# Patient Record
Sex: Female | Born: 1950 | Race: White | Hispanic: No | Marital: Married | Smoking: Never smoker
Health system: Southern US, Community
[De-identification: ages and names within clinical notes are randomized; demographics above are authoritative.]

## PROBLEM LIST (undated history)

## (undated) DIAGNOSIS — N3289 Other specified disorders of bladder: Secondary | ICD-10-CM

## (undated) DIAGNOSIS — M549 Dorsalgia, unspecified: Secondary | ICD-10-CM

## (undated) DIAGNOSIS — G629 Polyneuropathy, unspecified: Secondary | ICD-10-CM

## (undated) HISTORY — DX: Dorsalgia, unspecified: M54.9

## (undated) HISTORY — PX: AUGMENTATION MAMMAPLASTY: SUR837

## (undated) HISTORY — DX: Other specified disorders of bladder: N32.89

## (undated) HISTORY — DX: Polyneuropathy, unspecified: G62.9

---

## 2005-07-04 ENCOUNTER — Ambulatory Visit: Payer: Self-pay | Admitting: Gastroenterology

## 2005-08-10 ENCOUNTER — Ambulatory Visit: Payer: Self-pay | Admitting: Gastroenterology

## 2007-01-15 ENCOUNTER — Ambulatory Visit: Payer: Self-pay | Admitting: Orthopedic Surgery

## 2009-07-27 ENCOUNTER — Emergency Department: Payer: Self-pay | Admitting: Emergency Medicine

## 2013-04-08 ENCOUNTER — Encounter: Payer: Self-pay | Admitting: Diagnostic Neuroimaging

## 2013-04-08 ENCOUNTER — Ambulatory Visit (INDEPENDENT_AMBULATORY_CARE_PROVIDER_SITE_OTHER): Payer: BC Managed Care – PPO | Admitting: Diagnostic Neuroimaging

## 2013-04-08 ENCOUNTER — Other Ambulatory Visit: Payer: Self-pay | Admitting: *Deleted

## 2013-04-08 VITALS — BP 117/70 | HR 70 | Temp 98.7°F | Ht 67.0 in | Wt 126.0 lb

## 2013-04-08 DIAGNOSIS — R209 Unspecified disturbances of skin sensation: Secondary | ICD-10-CM

## 2013-04-08 DIAGNOSIS — R200 Anesthesia of skin: Secondary | ICD-10-CM

## 2013-04-08 DIAGNOSIS — R292 Abnormal reflex: Secondary | ICD-10-CM

## 2013-04-08 NOTE — Progress Notes (Signed)
[facility]  PATIENT: Megan Hays DOB: 1951-07-07  REFERRING CLINICIAN: Ramos HISTORY FROM: patient  REASON FOR VISIT: new consult   HISTORICAL  CHIEF COMPLAINT:  Chief Complaint  Patient presents with  . Neurologic Problem     Burning tingling in feet,  finger numbness    HISTORY OF PRESENT ILLNESS:   62 year old right-handed female here for evaluation of numbness, burning, tingling in hands and feet.  Patient has a long history of numbness and tingling in her feet, ankles, for many years, even as long as past 10 years. This was intermittent, and didn't bother her initially. More recently she has been having burning sensation in her feet, sensitivity to light touch such as the bed sheets on her toes, with nightly burning sensation. Symptoms also have progressed her hands. Sometimes the symptoms are independent, but oftentimes they're correlated in hands and feet together. Patient also notes intermittent color change in her fingertips, when he turned "white colored". She denies any blue, red or purple color changes in her fingers. She denies any cold intolerance in her fingers.  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 Patient has been on gabapentin 60 mg at night recently without significant relief.  REVIEW OF SYSTEMS: Full 14 system review of systems performed and notable only for fatigue joint pain and aching muscles not asleep numbness restless legs.  ALLERGIES: Not on File  HOME MEDICATIONS: No outpatient prescriptions prior to visit.   No facility-administered medications prior to visit.    PAST MEDICAL HISTORY: Past Medical History  Diagnosis Date  . Bladder spasms   . Back pain     PAST SURGICAL HISTORY: No past surgical history on file.  FAMILY HISTORY: Family History  Problem Relation Age of Onset  . Breast cancer Mother   . Lung cancer Father    . Diabetes Sister   . Heart disease Brother     SOCIAL HISTORY:  History   Social History  . Marital Status: Married    Spouse Name: N/A    Number of Children: 2  . Years of Education: 14   Occupational History  . national  [employer]    Social History Main Topics  . Smoking status: Never Smoker   . Smokeless tobacco: Not on file  . Alcohol Use: 0.0 oz/week     Comment: Patient drinks cafinated drinks 1 cup of coffee a day.Marland KitchenMarland KitchenDrinks wine occasionally  . Drug Use: Not on file  . Sexually Active: Not on file   Other Topics Concern  . Not on file   Social History Narrative  . No narrative on file     PHYSICAL EXAM  Filed Vitals:   04/08/13 0907  BP: 117/70  Pulse: 70  Temp: 98.7 F (37.1 C)  TempSrc: Oral  Height: 5\' 7"  (1.702 m)  Weight: 126 lb (57.153 kg)   Body mass index is 19.73 kg/(m^2).  GENERAL EXAM: Patient is in no distress  CARDIOVASCULAR: Regular rate and rhythm, no murmurs, no carotid bruits  NEUROLOGIC: MENTAL STATUS: awake, alert, language fluent, comprehension intact, naming intact CRANIAL NERVE: no papilledema on fundoscopic exam, pupils equal and reactive to light, visual fields full to confrontation, extraocular muscles intact, no nystagmus, facial sensation and strength symmetric, uvula midline, shoulder shrug symmetric, tongue midline. MOTOR: normal bulk and tone, full strength in the BUE, BLE SENSORY: normal and symmetric to light touch, pinprick, proprioception; RIGHT TOE VIB 10 SEC, LEFT TOE  VIB 5 SEC, DECR TEMP IN LEFT FOOT. COORDINATION: finger-nose-finger, fine finger movements normal REFLEXES: TRICEPS 3, BICEPS 2, BRACHIORAD 2, POSITIVE HOFFMANS ON RIGHT, KNEES 3 WITH SUPRAPATELLAR AND CROSSED ADDUCTORS, RIGHT ANKLE 2, LEFT ANKLE 1. MUTE TOES. GAIT/STATION: narrow based gait; able to walk on toes, heels and tandem; romberg is negative   DIAGNOSTIC DATA (LABS, IMAGING, TESTING) - I reviewed patient records, labs, notes, testing  and imaging myself where available.  No results found for this basename: WBC, HGB, HCT, MCV, PLT   No results found for this basename: na, k, cl, co2, glucose, bun, creatinine, calcium, prot, albumin, ast, alt, alkphos, bilitot, gfrnonaa, gfraa   No results found for this basename: CHOL, HDL, LDLCALC, LDLDIRECT, TRIG, CHOLHDL   No results found for this basename: HGBA1C   No results found for this basename: VITAMINB12   No results found for this basename: TSH     ASSESSMENT AND PLAN  62 y.o. year old female  has a past medical history of Bladder spasms and Back pain. here with numbness and tingling, burning sensation in the fingers, hands, toes and feet. Neurologic examination is notable for decreased sensation in the feet to vibration and temperature, and hyperreflexia in the upper and lower extremities. There is slightly increased reflexes on the right compared to left side.  Localization: cervical or thoracic spine, spinal root, nerve DDx: CNS inflammatory, compressive, degenerative, metabolic, autoimmune   Orders Placed This Encounter  Procedures  . MR Cervical Spine Wo Contrast  . Vitamin B12  . Hemoglobin A1c  . TSH  . NCV with EMG(electromyography)    PLAN: - continue gabapentin - EMG, labs, MRI c-spine  Suanne Marker, MD 04/08/2013, 10:07 AM Certified in Neurology, Neurophysiology and Neuroimaging  [facility] [address] [place], [place] [address] [phone]

## 2013-04-08 NOTE — Patient Instructions (Signed)
I will check MRI, EMG and labs.   

## 2013-04-09 LAB — HEMOGLOBIN A1C: Est. average glucose Bld gHb Est-mCnc: 114 mg/dL

## 2013-04-15 ENCOUNTER — Ambulatory Visit (INDEPENDENT_AMBULATORY_CARE_PROVIDER_SITE_OTHER): Payer: BC Managed Care – PPO | Admitting: Diagnostic Neuroimaging

## 2013-04-15 ENCOUNTER — Encounter (INDEPENDENT_AMBULATORY_CARE_PROVIDER_SITE_OTHER): Payer: BC Managed Care – PPO | Admitting: Radiology

## 2013-04-15 DIAGNOSIS — R292 Abnormal reflex: Secondary | ICD-10-CM

## 2013-04-15 DIAGNOSIS — R200 Anesthesia of skin: Secondary | ICD-10-CM

## 2013-04-15 DIAGNOSIS — Z0289 Encounter for other administrative examinations: Secondary | ICD-10-CM

## 2013-04-15 DIAGNOSIS — R209 Unspecified disturbances of skin sensation: Secondary | ICD-10-CM

## 2013-04-15 NOTE — Procedures (Signed)
   [facility]  NCS (NERVE CONDUCTION STUDY) WITH EMG (ELECTROMYOGRAPHY) REPORT   STUDY DATE: 04/15/13 PATIENT NAME: Megan Hays DOB: [birth year] MRN: [id number]  ORDERING CLINICIAN: Joycelyn Schmid, MD   TECHNOLOGIST: Kaylyn Lim ELECTROMYOGRAPHER: Glenford Bayley. Caitlain Tweed, MD  CLINICAL INFORMATION: 62 year old female with upper and lower extremity numbness and burning.  FINDINGS: NERVE CONDUCTION STUDY: Bilateral median, ulnar, peroneal and tibial motor responses have normal distal latencies, amplitudes, conduction velocities and F-wave latencies. Bilateral median, ulnar, sural sensory responses are normal. Bilateral ulnar transcarpal mixed nerve responses are normal. Bilateral median transcarpal mixed nerve responses have normal amplitudes and slow conduction velocities (right 43 m/s, left 39 m/s, normal greater than or equal to 48 m/s).  NEEDLE ELECTROMYOGRAPHY: Needle examination of selected muscles of the right upper and lower extremities (deltoid, biceps, triceps, flexor carpi radialis, first dorsal interosseous, vastus medialis, tibias interior, gastrocnemius) and right C5-6 and C6 and paraspinal muscles is normal. No abnormal spontaneous activity at rest and normal motor unit recruitment on exertion.  IMPRESSION:  Mildly abnormal study demonstrating: 1. Bilateral median neuropathies at the wrists consistent with bilateral carpal tunnel syndrome. 2. No evidence of underlying large fiber neuropathy. Based on clinical context, small fiber neuropathy cannot be excluded.   INTERPRETING PHYSICIAN:  Suanne Marker, MD Certified in Neurology, Neurophysiology and Neuroimaging  [facility] [address] [place], [place] [address] [phone]

## 2013-04-18 ENCOUNTER — Other Ambulatory Visit: Payer: BC Managed Care – PPO

## 2013-05-13 ENCOUNTER — Emergency Department: Payer: Self-pay | Admitting: Emergency Medicine

## 2014-04-07 ENCOUNTER — Encounter: Payer: Self-pay | Admitting: Family Medicine

## 2014-04-07 ENCOUNTER — Ambulatory Visit (INDEPENDENT_AMBULATORY_CARE_PROVIDER_SITE_OTHER): Payer: BC Managed Care – PPO | Admitting: Family Medicine

## 2014-04-07 VITALS — BP 114/70 | HR 79 | Temp 97.5°F | Ht 65.0 in | Wt 119.8 lb

## 2014-04-07 DIAGNOSIS — Z0000 Encounter for general adult medical examination without abnormal findings: Secondary | ICD-10-CM

## 2014-04-07 DIAGNOSIS — Z833 Family history of diabetes mellitus: Secondary | ICD-10-CM

## 2014-04-07 DIAGNOSIS — N3289 Other specified disorders of bladder: Secondary | ICD-10-CM

## 2014-04-07 DIAGNOSIS — Z79890 Hormone replacement therapy (postmenopausal): Secondary | ICD-10-CM

## 2014-04-07 DIAGNOSIS — Z136 Encounter for screening for cardiovascular disorders: Secondary | ICD-10-CM

## 2014-04-07 DIAGNOSIS — Z780 Asymptomatic menopausal state: Secondary | ICD-10-CM

## 2014-04-07 DIAGNOSIS — G609 Hereditary and idiopathic neuropathy, unspecified: Secondary | ICD-10-CM

## 2014-04-07 LAB — TSH: TSH: 0.83 u[IU]/mL (ref 0.35–5.50)

## 2014-04-07 LAB — COMPREHENSIVE METABOLIC PANEL
ALT: 15 U/L (ref 0–35)
AST: 22 U/L (ref 0–37)
Albumin: 4.2 g/dL (ref 3.5–5.2)
Alkaline Phosphatase: 41 U/L (ref 39–117)
BILIRUBIN TOTAL: 0.3 mg/dL (ref 0.3–1.2)
BUN: 11 mg/dL (ref 6–23)
CALCIUM: 9.1 mg/dL (ref 8.4–10.5)
CHLORIDE: 102 meq/L (ref 96–112)
CO2: 27 mEq/L (ref 19–32)
CREATININE: 0.7 mg/dL (ref 0.4–1.2)
GFR: 85.28 mL/min (ref >60.00)
Glucose, Bld: 88 mg/dL (ref 70–99)
Potassium: 4 mEq/L (ref 3.5–5.1)
Sodium: 136 mEq/L (ref 135–145)
Total Protein: 7 g/dL (ref 6.0–8.3)

## 2014-04-07 LAB — VITAMIN B12: Vitamin B-12: 819 pg/mL (ref 211–911)

## 2014-04-07 LAB — CBC WITH DIFFERENTIAL/PLATELET
BASOS PCT: 0.8 % (ref 0.0–3.0)
Basophils Absolute: 0.1 10*3/uL (ref 0.0–0.1)
EOS ABS: 0.3 10*3/uL (ref 0.0–0.7)
Eosinophils Relative: 3.4 % (ref 0.0–5.0)
HEMATOCRIT: 38.3 % (ref 36.0–46.0)
HEMOGLOBIN: 12.7 g/dL (ref 12.0–15.0)
LYMPHS ABS: 0.8 10*3/uL (ref 0.7–4.0)
LYMPHS PCT: 11.1 % — AB (ref 12.0–46.0)
MCHC: 33.1 g/dL (ref 30.0–36.0)
MCV: 95.9 fl (ref 78.0–100.0)
MONO ABS: 0.6 10*3/uL (ref 0.1–1.0)
Monocytes Relative: 7.7 % (ref 3.0–12.0)
NEUTROS ABS: 5.8 10*3/uL (ref 1.4–7.7)
Neutrophils Relative %: 77 % (ref 43.0–77.0)
Platelets: 274 10*3/uL (ref 150.0–400.0)
RBC: 4 Mil/uL (ref 3.87–5.11)
RDW: 13 % (ref 11.5–14.6)
WBC: 7.5 10*3/uL (ref 4.5–10.5)

## 2014-04-07 LAB — LIPID PANEL
CHOL/HDL RATIO: 2
Cholesterol: 178 mg/dL (ref 0–200)
HDL: 94.3 mg/dL (ref >39.00)
LDL Cholesterol: 55 mg/dL (ref 0–99)
TRIGLYCERIDES: 142 mg/dL (ref 0.0–149.0)
VLDL: 28.4 mg/dL (ref 0.0–40.0)

## 2014-04-07 LAB — HEMOGLOBIN A1C: Hgb A1c MFr Bld: 5 % (ref 4.6–6.5)

## 2014-04-07 NOTE — Assessment & Plan Note (Signed)
Reviewed preventive care protocols, scheduled due services, and updated immunizations Discussed nutrition, exercise, diet, and healthy lifestyle.  Orders Placed This Encounter  Procedures  . DG Bone Density  . CBC with Differential  . Comprehensive metabolic panel  . Lipid panel  . TSH  . Vitamin B12  . Hemoglobin A1c   Zostavax given today. Will request copy of colonoscopy [facility]). Bone density ordered.

## 2014-04-07 NOTE — Patient Instructions (Addendum)
Great to meet you. We will you with your lab results (and you can view them online) and your bone density appointment.

## 2014-04-07 NOTE — Assessment & Plan Note (Signed)
On Gabapentin 600 mg qhs. Check B12, TSH and a1c today.

## 2014-04-07 NOTE — Progress Notes (Signed)
Subjective:   Patient ID: Megan Hays Noorani, female    DOB: [birth year], 63 y.o.   MRN: [id number]  Megan Hays Manring is a pleasant 63 y.o. year old female who presents to clinic today with Establish Care and Annual Exam  on 04/07/2014  HPI: [place] in [place]. She is scheduling her mammogram for next week or two. Had a colonoscopy 5 or 7 years ago- per pt, 10 year recall. Has never had a bone density test. Never had a zostavax but did have chicken pox when she was younger.  Neuropathy- has seen neuro, cause is idiopathic.  Mainly at night- neurontin has been helping.  Recently retired- feels she is adjusting ok now.  It was hard at first. Husband goes to this practice as well, sees another MD.  Patient Active Problem List   Diagnosis Date Noted  . Unspecified hereditary and idiopathic peripheral neuropathy 04/07/2014  . Family history of diabetes mellitus 04/07/2014  . Routine general medical examination at a health care facility 04/07/2014  . Bladder spasms 04/07/2014   Past Medical History  Diagnosis Date  . Bladder spasms   . Back pain   . Neuropathy    History reviewed. No pertinent past surgical history. History  Substance Use Topics  . Smoking status: Never Smoker   . Smokeless tobacco: Never Used  . Alcohol Use: 0.0 oz/week     Comment: Patient drinks cafinated drinks 1 cup of coffee a day.Marland Kitchen.Marland Kitchen.Drinks wine occasionally   Family History  Problem Relation Age of Onset  . Breast cancer Mother   . Lung cancer Father   . Diabetes Sister   . Heart disease Brother    No Known Allergies Current Outpatient Prescriptions on File Prior to Visit  Medication Sig Dispense Refill  . gabapentin (NEURONTIN) 300 MG capsule Take 600 mg by mouth at bedtime.       Marland Kitchen. HYOPHEN 81.6 MG TABS       . ibuprofen (ADVIL,MOTRIN) 200 MG tablet Take 200 mg by mouth 2 (two) times daily.       No current facility-administered medications on file prior to visit.   The PMH, PSH, Social History,  Family History, Medications, and allergies [facility], and have been updated if relevant.   Review of Systems    See HPI No changes in her bowel habits No blood in her stool + urinary symptoms- has IC Denies any dysuria No CP or SOB  Objective:    BP 114/70  Pulse 79  Temp(Src) 97.5 F (36.4 C) (Oral)  Ht 5\' 5"  (1.651 m)  Wt 119 lb 12 oz (54.318 kg)  BMI 19.93 kg/m2  SpO2 98%   Physical Exam  General:  Well-developed,well-nourished,in no acute distress; alert,appropriate and cooperative throughout examination Head:  normocephalic and atraumatic.   Eyes:  vision grossly intact, pupils equal, pupils round, and pupils reactive to light.   Ears:  R ear normal and L ear normal.   Nose:  no external deformity.   Mouth:  good dentition.   Lungs:  Normal respiratory effort, chest expands symmetrically. Lungs are clear to auscultation, no crackles or wheezes. Heart:  Normal rate and regular rhythm. S1 and S2 normal without gallop, murmur, click, rub or other extra sounds. Abdomen:  Bowel sounds positive,abdomen soft and non-tender without masses, organomegaly or hernias noted. Msk:  No deformity or scoliosis noted of thoracic or lumbar spine.   Extremities:  No clubbing, cyanosis, edema, or deformity noted with normal full range  of motion of all joints.   Neurologic:  alert & oriented X3 and gait normal.   Skin:  Intact without suspicious lesions or rashes Cervical Nodes:  No lymphadenopathy noted Axillary Nodes:  No palpable lymphadenopathy Psych:  Cognition and judgment appear intact. Alert and cooperative with normal attention span and concentration. No apparent delusions, illusions, hallucinations        Assessment & Plan:   Unspecified hereditary and idiopathic peripheral neuropathy - Plan: TSH, Vitamin B12  Family history of diabetes mellitus - Plan: Hemoglobin A1c  Routine general medical examination at a health care facility - Plan: CBC with  Differential, Comprehensive metabolic panel  Screening for ischemic heart disease - Plan: Lipid panel  Post-menopausal - Plan: DG Bone Density  Bladder spasms No Follow-up on file.

## 2014-04-07 NOTE — Progress Notes (Signed)
Pre visit review using our clinic review tool, if applicable. No additional management support is needed unless otherwise documented below in the visit note. 

## 2014-04-08 ENCOUNTER — Encounter: Payer: Self-pay | Admitting: *Deleted

## 2014-06-30 ENCOUNTER — Encounter: Payer: Self-pay | Admitting: Internal Medicine

## 2014-06-30 ENCOUNTER — Ambulatory Visit (INDEPENDENT_AMBULATORY_CARE_PROVIDER_SITE_OTHER): Payer: BC Managed Care – PPO | Admitting: Internal Medicine

## 2014-06-30 VITALS — BP 120/70 | HR 70 | Temp 98.4°F | Wt 120.0 lb

## 2014-06-30 DIAGNOSIS — H1030 Unspecified acute conjunctivitis, unspecified eye: Secondary | ICD-10-CM

## 2014-06-30 DIAGNOSIS — H1033 Unspecified acute conjunctivitis, bilateral: Secondary | ICD-10-CM | POA: Insufficient documentation

## 2014-06-30 MED ORDER — SULFACETAMIDE SODIUM 10 % OP SOLN: 2.0000 [drp] | Freq: Four times a day (QID) | OPHTHALMIC | Status: DC

## 2014-06-30 NOTE — Progress Notes (Signed)
   Subjective:    Patient ID: Megan RaiderLinda Hays, female    DOB: 1951-06-08, 63 y.o.   MRN: [id number]  HPI Thinks she has conjunctivitis Had bad cold--probably from grandchild Another one of grandchildren had pink eye  Had discharge in left eye and crusting Tried allergy drops Now with some crusting on right lashes Painful --"not comfortable"  Had bad sore throat and laryngitis--this is better Tired zyrtec  Did try 2 days of left over drops (from her granddaughter)  Current Outpatient Prescriptions on File Prior to Visit  Medication Sig Dispense Refill  . gabapentin (NEURONTIN) 300 MG capsule Take 600 mg by mouth at bedtime.       Marland Kitchen. HYOPHEN 81.6 MG TABS       . ibuprofen (ADVIL,MOTRIN) 200 MG tablet Take 200 mg by mouth 2 (two) times daily.      . norethindrone-ethinyl estradiol (FEMHRT 1/5) 1-5 MG-MCG TABS Take 1 tablet by mouth daily.       No current facility-administered medications on file prior to visit.    No Known Allergies  Past Medical History  Diagnosis Date  . Bladder spasms   . Back pain   . Neuropathy     No past surgical history on file.  Family History  Problem Relation Age of Onset  . Breast cancer Mother   . Lung cancer Father   . Diabetes Sister   . Heart disease Brother     History   Social History  . Marital Status: Married    Spouse Name: N/A    Number of Children: 2  . Years of Education: 14   Occupational History  . national  [employer]    Social History Main Topics  . Smoking status: Never Smoker   . Smokeless tobacco: Never Used  . Alcohol Use: 0.0 oz/week     Comment: Patient drinks cafinated drinks 1 cup of coffee a day.Marland Kitchen.Marland Kitchen.Drinks wine occasionally  . Drug Use: No  . Sexual Activity: Yes   Other Topics Concern  . Not on file   Social History Narrative  . No narrative on file   Review of Systems No fever No loss of vision-but some mild photophobia with the son    Objective:   Physical Exam  Constitutional: She appears  well-developed and well-nourished. No distress.  Eyes:  Mild tarsal injection No crusting now          Assessment & Plan:

## 2014-06-30 NOTE — Progress Notes (Signed)
Pre visit review using our clinic review tool, if applicable. No additional management support is needed unless otherwise documented below in the visit note. 

## 2014-06-30 NOTE — Patient Instructions (Signed)
Please try these drops for 2 days. If you still have discomfort and sun sensitivity, you need to see your eye doctor right away.

## 2014-06-30 NOTE — Assessment & Plan Note (Signed)
May be partially treated infection Will try bleph 10  Given the pain and photophobia ---- may need to consider glaucoma To ophtho if not better in 2 days

## 2014-12-02 ENCOUNTER — Other Ambulatory Visit: Payer: Self-pay

## 2014-12-02 MED ORDER — GABAPENTIN 300 MG PO CAPS: 600.0000 mg | ORAL_CAPSULE | Freq: Every day | ORAL | Status: DC

## 2014-12-02 NOTE — Telephone Encounter (Signed)
Message left advising patient.  

## 2014-12-02 NOTE — Telephone Encounter (Signed)
Pt left v/m; pt saw Dr Dayton MartesAron on 04/07/14 and discussed Dr Dayton MartesAron starting to manage refills of gabapentin; pt has contacted [place] [facility] with no response about refilling gabapentin and pt only has 4 capsules left. Pt states for 1 year she has been taking gabapentin 300 mg taking 3 capsules at hs. Med list has taking 2 caps at hs. (med list has not been changed until approved by Dr Dayton MartesAron) Please advise. [facility]. Pt request cb when refilled.

## 2014-12-04 NOTE — Addendum Note (Signed)
Addended by: Desmond DikeKNIGHT, Leeta Grimme H on: 12/04/2014 12:40 PM   Modules accepted: Orders

## 2014-12-04 NOTE — Addendum Note (Signed)
Addended by: Desmond DikeKNIGHT, Jhoanna Heyde H on: 12/04/2014 12:41 PM   Modules accepted: Medications

## 2014-12-04 NOTE — Telephone Encounter (Signed)
Ok to refill as requested 

## 2014-12-04 NOTE — Telephone Encounter (Signed)
Pt left v/m checking on refill of gabapentin to [facility]; pt request gabapentin 300  mg taking 3 caps at hs. Med list has 2 caps at hs.Please advise. Med list has not been changed until Dr Elmer SowAron's approval. Pt request cb.

## 2014-12-04 NOTE — Addendum Note (Signed)
Addended by: Patience MuscaISLEY, RENA M on: 12/04/2014 12:14 PM   Modules accepted: Orders

## 2014-12-24 MED ORDER — GABAPENTIN 300 MG PO CAPS: 900.0000 mg | ORAL_CAPSULE | Freq: Every day | ORAL | Status: DC

## 2014-12-24 NOTE — Telephone Encounter (Addendum)
Pt left v/m; [facility] did send gabapentin rx on 12/02/14 with no refills; pt got that rx on 12/02/14 and pharmacist put Dr Dellie CatholicArons rx on hold. [facility] and [facility], the instructions read 2 capsules at bedtime and the additional instructions of can take 900 mg at hs did not appear on rx at pharmacy. Will change in system as instructed by Dr Dayton MartesAron and [facility]

## 2014-12-24 NOTE — Addendum Note (Signed)
Addended by: Patience MuscaISLEY, RENA M on: 12/24/2014 10:50 AM   Modules accepted: Orders

## 2015-05-15 ENCOUNTER — Other Ambulatory Visit: Payer: Self-pay | Admitting: Family Medicine

## 2015-06-12 ENCOUNTER — Other Ambulatory Visit: Payer: Self-pay | Admitting: Family Medicine

## 2015-07-06 ENCOUNTER — Telehealth: Payer: Self-pay

## 2015-07-06 NOTE — Telephone Encounter (Signed)
Left a voicemail for patient to return my call, in regards to scheduling a Mammogram.  

## 2015-07-11 ENCOUNTER — Other Ambulatory Visit: Payer: Self-pay | Admitting: Family Medicine

## 2015-07-14 ENCOUNTER — Encounter: Payer: Self-pay | Admitting: Family Medicine

## 2015-07-14 ENCOUNTER — Ambulatory Visit (INDEPENDENT_AMBULATORY_CARE_PROVIDER_SITE_OTHER): Payer: 59 | Admitting: Family Medicine

## 2015-07-14 VITALS — BP 130/72 | HR 70 | Temp 97.7°F | Ht 65.25 in | Wt 120.5 lb

## 2015-07-14 DIAGNOSIS — Z23 Encounter for immunization: Secondary | ICD-10-CM | POA: Diagnosis not present

## 2015-07-14 DIAGNOSIS — Z79890 Hormone replacement therapy (postmenopausal): Secondary | ICD-10-CM | POA: Diagnosis not present

## 2015-07-14 DIAGNOSIS — Z0000 Encounter for general adult medical examination without abnormal findings: Secondary | ICD-10-CM | POA: Diagnosis not present

## 2015-07-14 DIAGNOSIS — G609 Hereditary and idiopathic neuropathy, unspecified: Secondary | ICD-10-CM | POA: Diagnosis not present

## 2015-07-14 DIAGNOSIS — Z1239 Encounter for other screening for malignant neoplasm of breast: Secondary | ICD-10-CM

## 2015-07-14 DIAGNOSIS — N3289 Other specified disorders of bladder: Secondary | ICD-10-CM

## 2015-07-14 DIAGNOSIS — Z833 Family history of diabetes mellitus: Secondary | ICD-10-CM

## 2015-07-14 DIAGNOSIS — E2839 Other primary ovarian failure: Secondary | ICD-10-CM

## 2015-07-14 LAB — CBC WITH DIFFERENTIAL/PLATELET
BASOS ABS: 0 10*3/uL (ref 0.0–0.1)
Basophils Relative: 0.5 % (ref 0.0–3.0)
Eosinophils Absolute: 0.2 10*3/uL (ref 0.0–0.7)
Eosinophils Relative: 2.4 % (ref 0.0–5.0)
HCT: 40.8 % (ref 36.0–46.0)
Hemoglobin: 13.9 g/dL (ref 12.0–15.0)
LYMPHS ABS: 1.3 10*3/uL (ref 0.7–4.0)
LYMPHS PCT: 15.8 % (ref 12.0–46.0)
MCHC: 34 g/dL (ref 30.0–36.0)
MCV: 93.5 fl (ref 78.0–100.0)
MONO ABS: 0.6 10*3/uL (ref 0.1–1.0)
Monocytes Relative: 7.6 % (ref 3.0–12.0)
Neutro Abs: 6.2 10*3/uL (ref 1.4–7.7)
Neutrophils Relative %: 73.7 % (ref 43.0–77.0)
PLATELETS: 272 10*3/uL (ref 150.0–400.0)
RBC: 4.36 Mil/uL (ref 3.87–5.11)
RDW: 12.4 % (ref 11.5–15.5)
WBC: 8.4 10*3/uL (ref 4.0–10.5)

## 2015-07-14 LAB — LIPID PANEL
CHOLESTEROL: 200 mg/dL (ref 0–200)
HDL: 100.2 mg/dL (ref >39.00)
LDL CALC: 81 mg/dL (ref 0–99)
NONHDL: 99.41
TRIGLYCERIDES: 91 mg/dL (ref 0.0–149.0)
Total CHOL/HDL Ratio: 2
VLDL: 18.2 mg/dL (ref 0.0–40.0)

## 2015-07-14 LAB — TSH: TSH: 3.17 u[IU]/mL (ref 0.35–4.50)

## 2015-07-14 LAB — COMPREHENSIVE METABOLIC PANEL
ALT: 20 U/L (ref 0–35)
AST: 26 U/L (ref 0–37)
Albumin: 4.7 g/dL (ref 3.5–5.2)
Alkaline Phosphatase: 45 U/L (ref 39–117)
BUN: 12 mg/dL (ref 6–23)
CHLORIDE: 96 meq/L (ref 96–112)
CO2: 28 mEq/L (ref 19–32)
CREATININE: 0.69 mg/dL (ref 0.40–1.20)
Calcium: 9.4 mg/dL (ref 8.4–10.5)
GFR: 90.65 mL/min (ref >60.00)
Glucose, Bld: 76 mg/dL (ref 70–99)
Potassium: 3.9 mEq/L (ref 3.5–5.1)
SODIUM: 130 meq/L — AB (ref 135–145)
TOTAL PROTEIN: 7.1 g/dL (ref 6.0–8.3)
Total Bilirubin: 0.6 mg/dL (ref 0.2–1.2)

## 2015-07-14 MED ORDER — GABAPENTIN 300 MG PO CAPS: ORAL_CAPSULE | ORAL | Status: DC

## 2015-07-14 NOTE — Assessment & Plan Note (Signed)
Reviewed preventive care protocols, scheduled due services, and updated immunizations Discussed nutrition, exercise, diet, and healthy lifestyle.  She will call insurance company regarding zostavax coverage.  prevnar 13 given today.  Mammogram and dexa ordered.  Orders Placed This Encounter  Procedures  . MM Digital Screening  . DG Bone Density  . CBC with Differential/Platelet  . Comprehensive metabolic panel  . Lipid panel  . TSH

## 2015-07-14 NOTE — Progress Notes (Signed)
Subjective:   Patient ID: Megan Hays, female    DOB: 31-Aug-1951, 64 y.o.   MRN: [id number]  Megan Hays is a pleasant 64 y.o. year old female who presents to clinic today with Annual Exam  on 07/14/2015   I have not seen her since she established care in 03/2014.  HPI: Has GYN in [place]- UTD.  Has not had a bone density. Overdue for mammogram. Had a colonoscopy 7 years ago- per pt, 10 year recall.  Never had a zostavax but did have chicken pox when she was younger. Has never had pneumovax.  Neuropathy- has seen neuro, cause is idiopathic.  Mainly at night- neurontin has been helping.  Wants to try increased dose of neurontin on some nights when neuropathy is worse.  Enjoying retirement.  Traveling- going to [place] next week.  Lab Results  Component Value Date   CHOL 178 04/07/2014   HDL 94.30 04/07/2014   LDLCALC 55 04/07/2014   TRIG 142.0 04/07/2014   CHOLHDL 2 04/07/2014   Lab Results  Component Value Date   CREATININE 0.7 04/07/2014   Lab Results  Component Value Date   WBC 7.5 04/07/2014   HGB 12.7 04/07/2014   HCT 38.3 04/07/2014   MCV 95.9 04/07/2014   PLT 274.0 04/07/2014   Lab Results  Component Value Date   NA 136 04/07/2014   K 4.0 04/07/2014   CL 102 04/07/2014   CO2 27 04/07/2014   Lab Results  Component Value Date   TSH 0.83 04/07/2014     Patient Active Problem List   Diagnosis Date Noted  . Hereditary and idiopathic peripheral neuropathy 04/07/2014  . Family history of diabetes mellitus 04/07/2014  . Routine general medical examination at a health care facility 04/07/2014  . Bladder spasms 04/07/2014  . Postmenopausal HRT (hormone replacement therapy) 04/07/2014   Past Medical History  Diagnosis Date  . Bladder spasms   . Back pain   . Neuropathy    No past surgical history on file. History  Substance Use Topics  . Smoking status: Never Smoker   . Smokeless tobacco: Never Used  . Alcohol Use: 0.0 oz/week     Comment:  Patient drinks cafinated drinks 1 cup of coffee a day.Marland KitchenMarland KitchenDrinks wine occasionally   Family History  Problem Relation Age of Onset  . Breast cancer Mother   . Lung cancer Father   . Diabetes Sister   . Heart disease Brother    No Known Allergies Current Outpatient Prescriptions on File Prior to Visit  Medication Sig Dispense Refill  . gabapentin (NEURONTIN) 300 MG capsule TAKE 3 CAPSULES (900 MG TOTAL) BY MOUTH AT BEDTIME. 90 capsule 0  . HYOPHEN 81.6 MG TABS     . ibuprofen (ADVIL,MOTRIN) 200 MG tablet Take 200 mg by mouth 2 (two) times daily.    . norethindrone-ethinyl estradiol (FEMHRT 1/5) 1-5 MG-MCG TABS Take 1 tablet by mouth daily.    Marland Kitchen sulfacetamide (BLEPH-10) 10 % ophthalmic solution Place 2 drops into both eyes 4 (four) times daily. 15 mL 0   No current facility-administered medications on file prior to visit.   The PMH, PSH, Social History, Family History, Medications, and allergies have been reviewed in [facility], and have been updated if relevant.   Review of Systems  Constitutional: Negative.   HENT: Negative.   Eyes: Negative.   Respiratory: Negative.   Cardiovascular: Negative.   Gastrointestinal: Negative.   Endocrine: Negative.   Musculoskeletal: Negative.   Skin: Negative.  Allergic/Immunologic: Negative.   Neurological: Positive for numbness.  Hematological: Negative.   Psychiatric/Behavioral: Negative.   All other systems reviewed and are negative.     Objective:    BP 130/72 mmHg  Pulse 70  Temp(Src) 97.7 F (36.5 C) (Oral)  Ht 5' 5.25" (1.657 m)  Wt 120 lb 8 oz (54.658 kg)  BMI 19.91 kg/m2  SpO2 97%   Physical Exam  General:  Well-developed,well-nourished,in no acute distress; alert,appropriate and cooperative throughout examination Head:  normocephalic and atraumatic.   Eyes:  vision grossly intact, pupils equal, pupils round, and pupils reactive to light.   Ears:  R ear normal and L ear normal.   Nose:  no external deformity.   Mouth:   good dentition.   Breasts:  Pos implants bilaterally, no masses or tenderness Lungs:  Normal respiratory effort, chest expands symmetrically. Lungs are clear to auscultation, no crackles or wheezes. Heart:  Normal rate and regular rhythm. S1 and S2 normal without gallop, murmur, click, rub or other extra sounds. Abdomen:  Bowel sounds positive,abdomen soft and non-tender without masses, organomegaly or hernias noted. Msk:  No deformity or scoliosis noted of thoracic or lumbar spine.   Extremities:  No clubbing, cyanosis, edema, or deformity noted with normal full range of motion of all joints.   Neurologic:  alert & oriented X3 and gait normal.   Skin:  Intact without suspicious lesions or rashes Cervical Nodes:  No lymphadenopathy noted Axillary Nodes:  No palpable lymphadenopathy Psych:  Cognition and judgment appear intact. Alert and cooperative with normal attention span and concentration. No apparent delusions, illusions, hallucinations        Assessment & Plan:   Routine general medical examination at a health care facility  Postmenopausal HRT (hormone replacement therapy)  Family history of diabetes mellitus  Bladder spasms  Hereditary and idiopathic peripheral neuropathy No Follow-up on file.

## 2015-07-14 NOTE — Assessment & Plan Note (Addendum)
Ok to try increasing Gabapentin to 1200 mg day prn neuropathy. Call or return to clinic prn if these symptoms worsen or fail to improve as anticipated. The patient indicates understanding of these issues and agrees with the plan. eRx sent.

## 2015-07-14 NOTE — Progress Notes (Signed)
Pre visit review using our clinic review tool, if applicable. No additional management support is needed unless otherwise documented below in the visit note. 

## 2015-07-14 NOTE — Addendum Note (Signed)
Addended by: Desmond Dike on: 07/14/2015 01:07 PM   Modules accepted: Orders

## 2015-07-14 NOTE — Patient Instructions (Addendum)
Check with your insurance to see if they will cover the shingles shot.  We will call you with your lab results from today and you can see them online.  Please call [facility] to schedule your mammogram and bone density.

## 2015-07-15 ENCOUNTER — Encounter: Payer: Self-pay | Admitting: *Deleted

## 2015-08-19 ENCOUNTER — Other Ambulatory Visit: Payer: Self-pay | Admitting: Family Medicine

## 2015-08-21 NOTE — Telephone Encounter (Signed)
On 08/29/15 Pt going to [facility] for 3 weeks and request refill gabapentin; advised pt refill already done. [facility] said requesting too early; Dorathy Daft tried to run as vacation request and rx went thru; pt voiced understanding.

## 2015-08-24 ENCOUNTER — Ambulatory Visit (INDEPENDENT_AMBULATORY_CARE_PROVIDER_SITE_OTHER)
Admission: RE | Admit: 2015-08-24 | Discharge: 2015-08-24 | Disposition: A | Discharge status: Home / Self Care | Payer: 59 | Source: Ambulatory Visit | Attending: Family Medicine | Admitting: Family Medicine

## 2015-08-24 ENCOUNTER — Ambulatory Visit (INDEPENDENT_AMBULATORY_CARE_PROVIDER_SITE_OTHER): Payer: 59 | Admitting: Family Medicine

## 2015-08-24 ENCOUNTER — Encounter: Payer: Self-pay | Admitting: Family Medicine

## 2015-08-24 VITALS — BP 126/60 | HR 82 | Temp 98.1°F | Wt 121.0 lb

## 2015-08-24 DIAGNOSIS — M25562 Pain in left knee: Secondary | ICD-10-CM

## 2015-08-24 NOTE — Patient Instructions (Signed)
Great to see you. Please make an appointment with Dr. Patsy Lager this week on your way out.  I will call you with your xray results.

## 2015-08-24 NOTE — Progress Notes (Signed)
Pre visit review using our clinic review tool, if applicable. No additional management support is needed unless otherwise documented below in the visit note. 

## 2015-08-24 NOTE — Progress Notes (Signed)
SUBJECTIVE: Megan Hays is a 64 y.o. female who has noticed left medial knee pain and swelling for past month.  Started after she started exercising after lengthy recovery from left bunion surgery.  Had to wear a long cast s/p bunion surgery.  No known injury.  Has been icing it and elevating  Still walking three miles a day. Prn NSAIDs have helped.  She is concerned because she is going on a trip to [place] next week and has a hiking and biking trip scheduled.  Current Outpatient Prescriptions on File Prior to Visit  Medication Sig Dispense Refill  . gabapentin (NEURONTIN) 300 MG capsule TAKE 4 CAPSULES BY MOUTH AT BEDTIME 120 capsule 5  . HYOPHEN 81.6 MG TABS     . ibuprofen (ADVIL,MOTRIN) 200 MG tablet Take 200 mg by mouth 2 (two) times daily.    . norethindrone-ethinyl estradiol (FEMHRT 1/5) 1-5 MG-MCG TABS Take 1 tablet by mouth daily.    Marland Kitchen sulfacetamide (BLEPH-10) 10 % ophthalmic solution Place 2 drops into both eyes 4 (four) times daily. 15 mL 0   No current facility-administered medications on file prior to visit.    No Known Allergies  Past Medical History  Diagnosis Date  . Bladder spasms   . Back pain   . Neuropathy     History reviewed. No pertinent past surgical history.  Family History  Problem Relation Age of Onset  . Breast cancer Mother   . Lung cancer Father   . Diabetes Sister   . Heart disease Brother     Social History   Social History  . Marital Status: Married    Spouse Name: N/A  . Number of Children: 2  . Years of Education: 14   Occupational History  . national  [employer]    Social History Main Topics  . Smoking status: Never Smoker   . Smokeless tobacco: Never Used  . Alcohol Use: 0.0 oz/week     Comment: Patient drinks cafinated drinks 1 cup of coffee a day.Marland KitchenMarland KitchenDrinks wine occasionally  . Drug Use: No  . Sexual Activity: Yes   Other Topics Concern  . Not on file   Social History Narrative   The PMH, PSH, Social History, Family  History, Medications, and allergies have been reviewed in [facility], and have been updated if relevant.  OBJECTIVE: BP 126/60 mmHg  Pulse 82  Temp(Src) 98.1 F (36.7 C) (Oral)  Wt 121 lb (54.885 kg)  SpO2 98%  Vital signs as noted above. Appearance: alert, well appearing, and in no distress. Knee exam: soft tissue tenderness over medial joint line. X-ray: ordered, but results not yet available.  ASSESSMENT: Knee meniscal injury and internal derangement  PLAN: rest the injured area as much as practical, XRAY today and see Dr. Patsy Lager for possible cortisone knee injection. See orders for this visit as documented in the electronic medical record. The patient indicates understanding of these issues and agrees with the plan.

## 2015-08-27 ENCOUNTER — Ambulatory Visit (INDEPENDENT_AMBULATORY_CARE_PROVIDER_SITE_OTHER): Payer: 59 | Admitting: Family Medicine

## 2015-08-27 ENCOUNTER — Encounter: Payer: Self-pay | Admitting: Family Medicine

## 2015-08-27 VITALS — BP 120/60 | HR 76 | Temp 98.7°F | Ht 65.25 in | Wt 120.2 lb

## 2015-08-27 DIAGNOSIS — M6752 Plica syndrome, left knee: Secondary | ICD-10-CM | POA: Diagnosis not present

## 2015-08-27 DIAGNOSIS — M25562 Pain in left knee: Secondary | ICD-10-CM | POA: Diagnosis not present

## 2015-08-27 MED ORDER — MELOXICAM 15 MG PO TABS: 15.0000 mg | ORAL_TABLET | Freq: Every day | ORAL | Status: AC

## 2015-08-27 MED ORDER — TRAMADOL HCL 50 MG PO TABS: 50.0000 mg | ORAL_TABLET | Freq: Four times a day (QID) | ORAL | Status: AC | PRN

## 2015-08-27 NOTE — Progress Notes (Signed)
Pre visit review using our clinic review tool, if applicable. No additional management support is needed unless otherwise documented below in the visit note. 

## 2015-08-27 NOTE — Patient Instructions (Signed)
PLICA BAND or PLICA on the knee  Band of tissue / Kink in the synovial lining. The joint lining.  Use your fingers a few times a day for the next few weeks to massage that area to help break down the tissue. You can also use an ice cube.  

## 2015-08-27 NOTE — Progress Notes (Signed)
Dr. Karleen Hampshire T. Vyncent Overby, MD, CAQ Sports [facility] [address] [place], [address] Phone: [phone] Fax: [phone]  08/27/2015  Patient: Megan Hays, MRN: [id number], DOB: 12-Jul-1951, 64 y.o.  Primary Physician:  Ruthe Mannan, MD  Chief Complaint: Knee Pain  Subjective:   Megan Hays is a 64 y.o. very pleasant female patient who presents with the following:  L knee pain:  Had a bunion surgery and cast for for 9 weeks, then with the cast removal, then big trip coming up to [place]. Trying to get back into shape. Tyring to get nack into shape. Somehow overdid it. Did a 3 mile, then went for a walk on the beach. Pain going up and down stairs now. Locking knee with going up and down stairs. If going up on activity slightly.   She is getting most of her pain medially, and she is having some catching underneath her kneecap.  She is not currently having any kind of effusion and is not having any mechanical symptoms.  Past Medical History, Surgical History, Social History, Family History, Problem List, Medications, and Allergies have been reviewed and updated if relevant.  Patient Active Problem List   Diagnosis Date Noted  . Left knee pain 08/24/2015  . Hereditary and idiopathic peripheral neuropathy 04/07/2014  . Family history of diabetes mellitus 04/07/2014  . Routine general medical examination at a health care facility 04/07/2014  . Bladder spasms 04/07/2014  . Postmenopausal HRT (hormone replacement therapy) 04/07/2014    Past Medical History  Diagnosis Date  . Bladder spasms   . Back pain   . Neuropathy     No past surgical history on file.  Social History   Social History  . Marital Status: Married    Spouse Name: N/A  . Number of Children: 2  . Years of Education: 14   Occupational History  . national  [employer]    Social History Main Topics  . Smoking status: Never Smoker   . Smokeless tobacco: Never Used  .  Alcohol Use: 0.0 oz/week     Comment: Patient drinks cafinated drinks 1 cup of coffee a day.Marland KitchenMarland KitchenDrinks wine occasionally  . Drug Use: No  . Sexual Activity: Yes   Other Topics Concern  . Not on file   Social History Narrative    Family History  Problem Relation Age of Onset  . Breast cancer Mother   . Lung cancer Father   . Diabetes Sister   . Heart disease Brother     No Known Allergies  Medication list reviewed and updated in full in [facility].  GEN: No fevers, chills. Nontoxic. Primarily MSK c/o today. MSK: Detailed in the HPI GI: tolerating PO intake without difficulty Neuro: No numbness, parasthesias, or tingling associated. Otherwise the pertinent positives of the ROS are noted above.   Objective:   BP 120/60 mmHg  Pulse 76  Temp(Src) 98.7 F (37.1 C) (Oral)  Ht 5' 5.25" (1.657 m)  Wt 120 lb 4 oz (54.545 kg)  BMI 19.87 kg/m2   GEN: WDWN, NAD, Non-toxic, Alert & Oriented x 3 HEENT: Atraumatic, Normocephalic.  Ears and Nose: No external deformity. EXTR: No clubbing/cyanosis/edema NEURO: Normal gait.  PSYCH: Normally interactive. Conversant. Not depressed or anxious appearing.  Calm demeanor.   Knee:  L Gait: Normal heel toe pattern ROM: 0-130 Effusion: neg Echymosis or edema: none Patellar tendon NT Painful PLICA: medial Patellar grind: negative Medial and lateral patellar facet loading:  negative medial and lateral joint lines:NT Mcmurray's neg Flexion-pinch neg Varus and valgus stress: stable Lachman: neg Ant and Post drawer: neg Hip abduction, IR, ER: WNL Hip flexion str: 5/5 Hip abd: 5/5 Quad: 5/5 VMO atrophy:No Hamstring concentric and eccentric: 5/5   Radiology: Dg Knee Complete 4 Views Left  08/24/2015   CLINICAL DATA:  Left knee pain for 1 month. No known injury. Anterior knee pain.  EXAM: LEFT KNEE - COMPLETE 4+ VIEW  COMPARISON:  None.  FINDINGS: Mild spurring along the tibial spines. No acute bony abnormality. Specifically, no  fracture, subluxation, or dislocation. Soft tissues are intact. No joint effusion.  IMPRESSION: No acute bony abnormality.   Electronically Signed   By: Charlett Nose M.D.   On: 08/24/2015 11:28    Assessment and Plan:   Plica of knee, left  Left knee pain  Painful plica band ID'd on exam Massage for 8 weeks with hand multiple times a day and with an ice cube  If fails, would do a plica injection along band of tissue.  Ultimately, if all treatment fails, surgical synovectomy could be considered.   Follow-up: No Follow-up on file.  New Prescriptions   MELOXICAM (MOBIC) 15 MG TABLET    Take 1 tablet (15 mg total) by mouth daily.   TRAMADOL (ULTRAM) 50 MG TABLET    Take 1 tablet (50 mg total) by mouth every 6 (six) hours as needed.   No orders of the defined types were placed in this encounter.    Signed,  Elpidio Galea. Seamus Warehime, MD   Patient's Medications  New Prescriptions   MELOXICAM (MOBIC) 15 MG TABLET    Take 1 tablet (15 mg total) by mouth daily.   TRAMADOL (ULTRAM) 50 MG TABLET    Take 1 tablet (50 mg total) by mouth every 6 (six) hours as needed.  Previous Medications   GABAPENTIN (NEURONTIN) 300 MG CAPSULE    TAKE 4 CAPSULES BY MOUTH AT BEDTIME   HYOPHEN 81.6 MG TABS       IBUPROFEN (ADVIL,MOTRIN) 200 MG TABLET    Take 200 mg by mouth 2 (two) times daily.   NORETHINDRONE-ETHINYL ESTRADIOL (FEMHRT 1/5) 1-5 MG-MCG TABS    Take 1 tablet by mouth daily.   SULFACETAMIDE (BLEPH-10) 10 % OPHTHALMIC SOLUTION    Place 2 drops into both eyes 4 (four) times daily.  Modified Medications   No medications on file  Discontinued Medications   No medications on file

## 2015-10-05 ENCOUNTER — Encounter: Payer: Self-pay | Admitting: *Deleted

## 2015-10-05 ENCOUNTER — Ambulatory Visit
Admission: RE | Admit: 2015-10-05 | Discharge: 2015-10-05 | Disposition: A | Discharge status: Home / Self Care | Payer: 59 | Source: Ambulatory Visit | Attending: Family Medicine | Admitting: Family Medicine

## 2015-10-05 ENCOUNTER — Other Ambulatory Visit: Payer: Self-pay | Admitting: Family Medicine

## 2015-10-05 DIAGNOSIS — M85852 Other specified disorders of bone density and structure, left thigh: Secondary | ICD-10-CM | POA: Diagnosis not present

## 2015-10-05 DIAGNOSIS — M8588 Other specified disorders of bone density and structure, other site: Secondary | ICD-10-CM | POA: Diagnosis not present

## 2015-10-05 DIAGNOSIS — E2839 Other primary ovarian failure: Secondary | ICD-10-CM

## 2015-10-05 DIAGNOSIS — Z780 Asymptomatic menopausal state: Secondary | ICD-10-CM | POA: Diagnosis present

## 2015-10-05 DIAGNOSIS — Z1239 Encounter for other screening for malignant neoplasm of breast: Secondary | ICD-10-CM

## 2015-10-05 DIAGNOSIS — Z1231 Encounter for screening mammogram for malignant neoplasm of breast: Secondary | ICD-10-CM | POA: Diagnosis not present

## 2015-11-20 ENCOUNTER — Ambulatory Visit (INDEPENDENT_AMBULATORY_CARE_PROVIDER_SITE_OTHER): Payer: 59 | Admitting: Primary Care

## 2015-11-20 ENCOUNTER — Encounter: Payer: Self-pay | Admitting: Primary Care

## 2015-11-20 VITALS — BP 116/70 | HR 70 | Temp 98.0°F | Ht 65.25 in | Wt 123.8 lb

## 2015-11-20 DIAGNOSIS — J029 Acute pharyngitis, unspecified: Secondary | ICD-10-CM

## 2015-11-20 LAB — POCT RAPID STREP A (OFFICE): Rapid Strep A Screen: NEGATIVE

## 2015-11-20 NOTE — Progress Notes (Signed)
Subjective:    Patient ID: Megan Hays, female    DOB: Mar 09, 1951, 64 y.o.   MRN: [id number]  HPI  Megan Hays is a 64 year old female who presents today with a chief complaint of sore throat. Her sore throat has been present for slightly greater than one week. She's been around her granddaughter who recently had a sore throat. She also reports post nasal drip. Denies fevers, chills, cough, fatigue. She's been taking Zyrtec for 1 week without significant difference.  Review of Systems  Constitutional: Negative for fever, chills and fatigue.  HENT: Positive for congestion, rhinorrhea and sore throat. Negative for sinus pressure.   Respiratory: Negative for cough.   Cardiovascular: Negative for chest pain.  Musculoskeletal: Negative for myalgias.       Past Medical History  Diagnosis Date  . Bladder spasms   . Back pain   . Neuropathy [facility])     Social History   Social History  . Marital Status: Married    Spouse Name: N/A  . Number of Children: 2  . Years of Education: 14   Occupational History  . national  [employer]    Social History Main Topics  . Smoking status: Never Smoker   . Smokeless tobacco: Never Used  . Alcohol Use: 0.0 oz/week     Comment: Patient drinks cafinated drinks 1 cup of coffee a day.Marland Kitchen.Marland Kitchen.Drinks wine occasionally  . Drug Use: No  . Sexual Activity: Yes   Other Topics Concern  . Not on file   Social History Narrative    Past Surgical History  Procedure Laterality Date  . Augmentation mammaplasty Bilateral     years ago. replaced when right side ruptured 5 years ago    Family History  Problem Relation Age of Onset  . Breast cancer Mother [id number]  . Lung cancer Father   . Diabetes Sister   . Heart disease Brother     No Known Allergies  Current Outpatient Prescriptions on File Prior to Visit  Medication Sig Dispense Refill  . gabapentin (NEURONTIN) 300 MG capsule TAKE 4 CAPSULES BY MOUTH AT BEDTIME 120 capsule 5  . HYOPHEN 81.6 MG TABS      . ibuprofen (ADVIL,MOTRIN) 200 MG tablet Take 200 mg by mouth 2 (two) times daily.    . norethindrone-ethinyl estradiol (FEMHRT 1/5) 1-5 MG-MCG TABS Take 1 tablet by mouth daily.    . meloxicam (MOBIC) 15 MG tablet Take 1 tablet (15 mg total) by mouth daily. (Patient not taking: Reported on 11/20/2015) 30 tablet 2  . traMADol (ULTRAM) 50 MG tablet Take 1 tablet (50 mg total) by mouth every 6 (six) hours as needed. (Patient not taking: Reported on 11/20/2015) 50 tablet 2   No current facility-administered medications on file prior to visit.    BP 116/70 mmHg  Pulse 70  Temp(Src) 98 F (36.7 C) (Oral)  Ht 5' 5.25" (1.657 m)  Wt 123 lb 12.8 oz (56.155 kg)  BMI 20.45 kg/m2  SpO2 98%    Objective:   Physical Exam  Constitutional: She appears well-nourished.  HENT:  Right Ear: Tympanic membrane and ear canal normal.  Left Ear: Tympanic membrane and ear canal normal.  Nose: Right sinus exhibits no maxillary sinus tenderness and no frontal sinus tenderness. Left sinus exhibits no maxillary sinus tenderness and no frontal sinus tenderness.  Mouth/Throat: Posterior oropharyngeal erythema present. No oropharyngeal exudate or posterior oropharyngeal edema.  Eyes: Pupils are equal, round, and reactive to light.  Neck: Neck supple.  Cardiovascular: Normal rate and regular rhythm.   Pulmonary/Chest: Effort normal and breath sounds normal.  Lymphadenopathy:    She has no cervical adenopathy.  Skin: Skin is warm and dry.          Assessment & Plan:  Sore throat due to postnasal drip:  Present greater than 1 week with postnasal drip. No fevers, chills, fatigue, cough. Taking Zyrtec for 1 week. Rapid Strep: Negative. Treat with supportive measures. Ibuprofen, warm salt gargles, flonase, continue zyrtec. Return precautions provided.

## 2015-11-20 NOTE — Patient Instructions (Signed)
You do not have strep.  Your sore throat is likely caused by allergies.  Continue Zyrtec or consider switching to Allegra. Try Flonase nasal spray for nasal congestion. Instill 2 sprays in each nostril once daily.  Please call me if you develop fevers, cough, or fatigue, start cough up mucous, start feeling worse.  It was a pleasure meeting you!

## 2015-11-20 NOTE — Addendum Note (Signed)
Addended by: Tawnya CrookSAMBATH, Annalysa Mohammad on: 11/20/2015 01:34 PM   Modules accepted: Orders

## 2015-11-20 NOTE — Progress Notes (Signed)
Pre visit review using our clinic review tool, if applicable. No additional management support is needed unless otherwise documented below in the visit note. 

## 2016-02-01 ENCOUNTER — Telehealth: Payer: Self-pay | Admitting: Family Medicine

## 2016-02-01 NOTE — Telephone Encounter (Signed)
[facility] Day - Client TELEPHONE ADVICE RECORD [facility]  Patient Name: Megan Hays  DOB: [birth year]    Initial Comment Caller states she was seen in a walk in over the weekend and dx with a bladder infection. Was given an antibiotic. She is still not better.    Nurse Assessment  Nurse: Laural Benes, RN, Dondra Spry Date/Time Lamount Cohen Time): 02/01/2016 4:08:18 PM  Confirm and document reason for call. If symptomatic, describe symptoms. You must click the next button to save text entered. ---[name] seen in walk in clinic over the weekend and given ciprofloxin for bladder infection and still having bladder spasm  Has the patient traveled out of the country within the last 30 days? ---No  Does the patient have any new or worsening symptoms? ---Yes  Will a triage be completed? ---Yes  Related visit to physician within the last 2 weeks? ---No  Does the PT have any chronic conditions? (i.e. diabetes, asthma, etc.) ---No  Is this a behavioral health or substance abuse call? ---No     Guidelines    Guideline Title Affirmed Question Affirmed Notes  Urinary Tract Infection on Antibiotic Follow-up Call - Female [1] Taking antibiotic < 72 hours (3 days) for UTI AND [2] painful urination or frequency not improved (all triage questions negative)    Final Disposition User   [facility], RN, Dondra Spry    Comments  ALERT ALERT ALERT CAN MD CALL IN SOMETHING FOR BLADDER SPASMS -- dx with bladder infection over weekend on ciprofloxin and seems to be working but continue to have the cramping -- leaving on vacation at 630am flying and unable to come in for appt.   Referrals  GO TO FACILITY UNDECIDED   Disagree/Comply: Comply

## 2016-02-04 ENCOUNTER — Other Ambulatory Visit: Payer: Self-pay | Admitting: Family Medicine

## 2016-07-24 ENCOUNTER — Other Ambulatory Visit: Payer: Self-pay | Admitting: Family Medicine

## 2016-08-30 ENCOUNTER — Other Ambulatory Visit: Payer: Self-pay | Admitting: Family Medicine

## 2017-06-14 IMAGING — CR DG KNEE COMPLETE 4+V*L*
5 series · 5 of 5 positions shown · non-contrast
Comparison: None.

CLINICAL DATA: Left knee pain for 1 month. No known injury.
Anterior knee pain.

EXAM:
LEFT KNEE - COMPLETE 4+ VIEW

[view not recorded (1 of 5)]
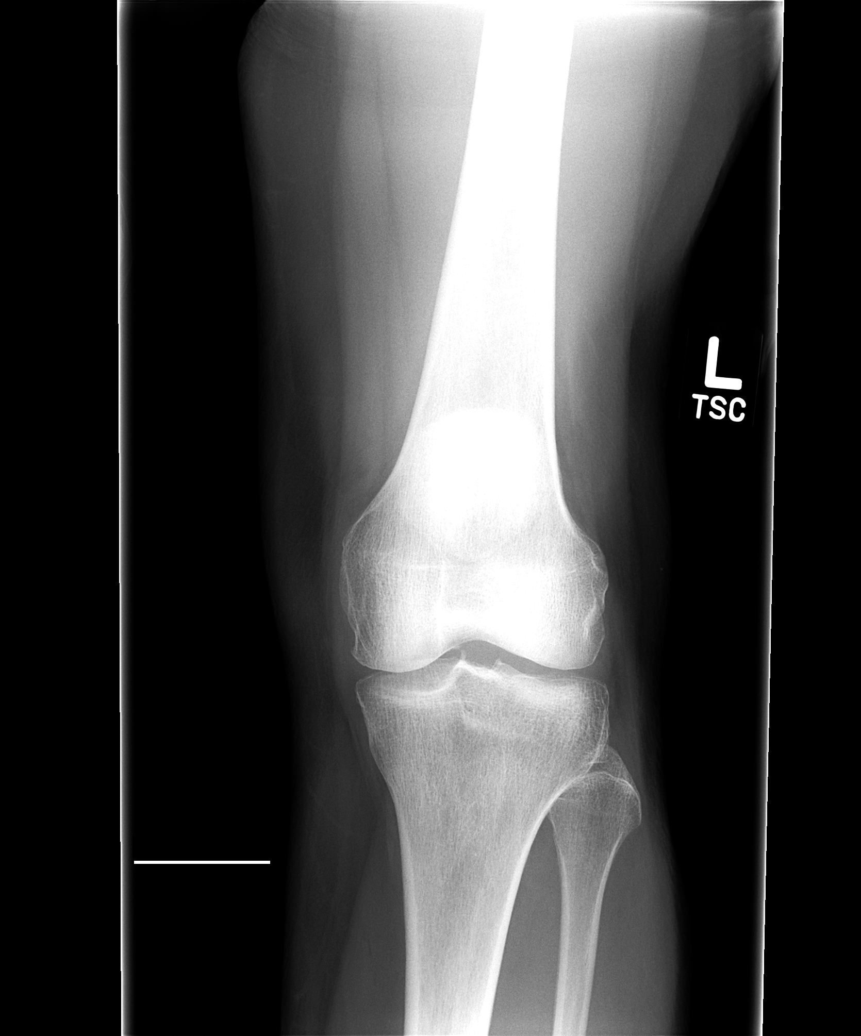

[view not recorded (2 of 5)]
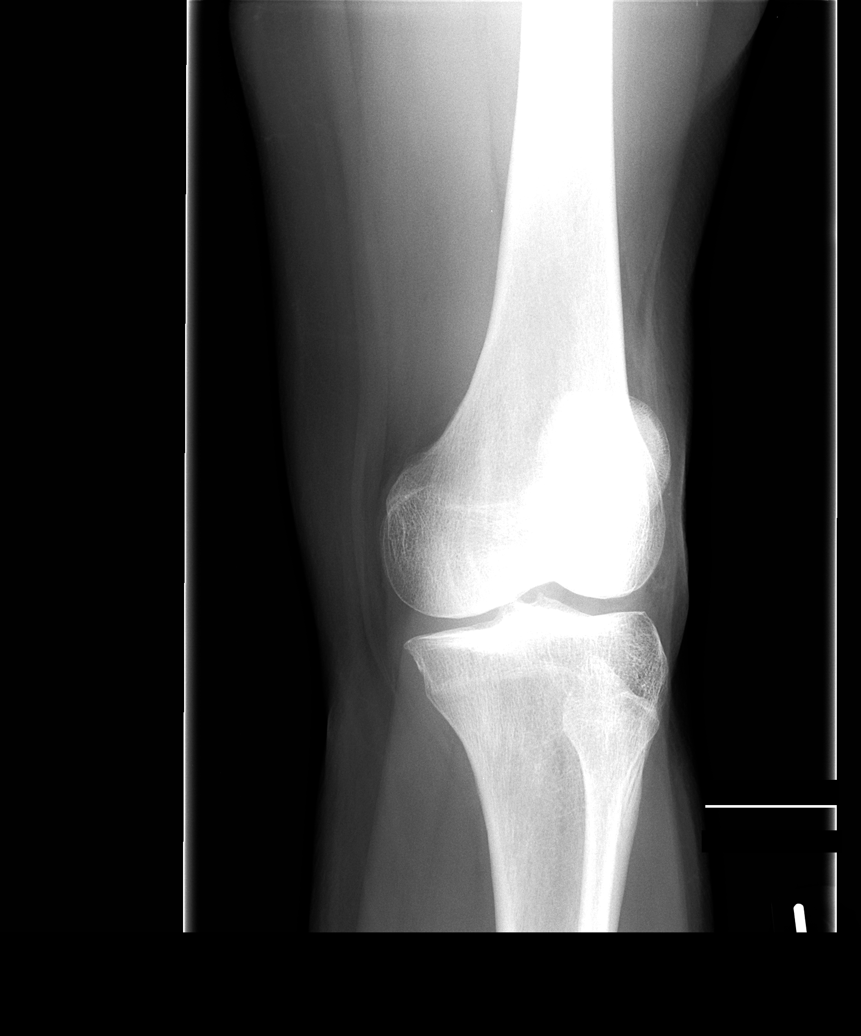

[view not recorded (3 of 5)]
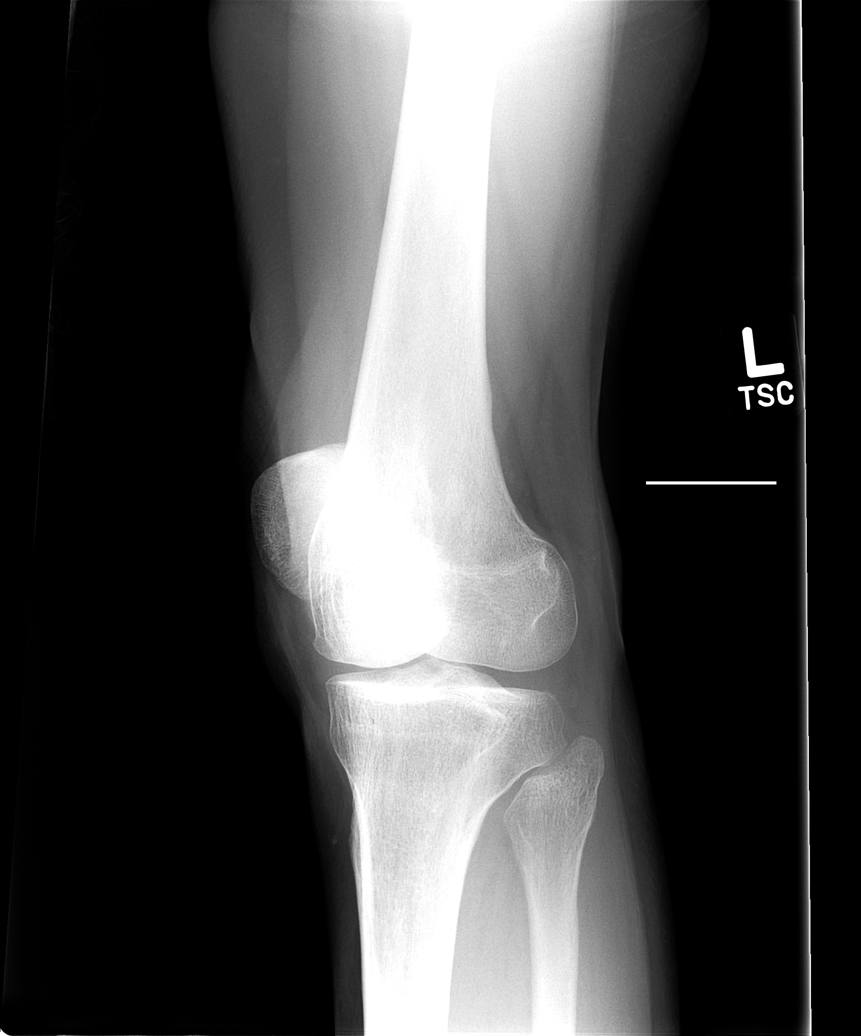

[view not recorded (4 of 5)]
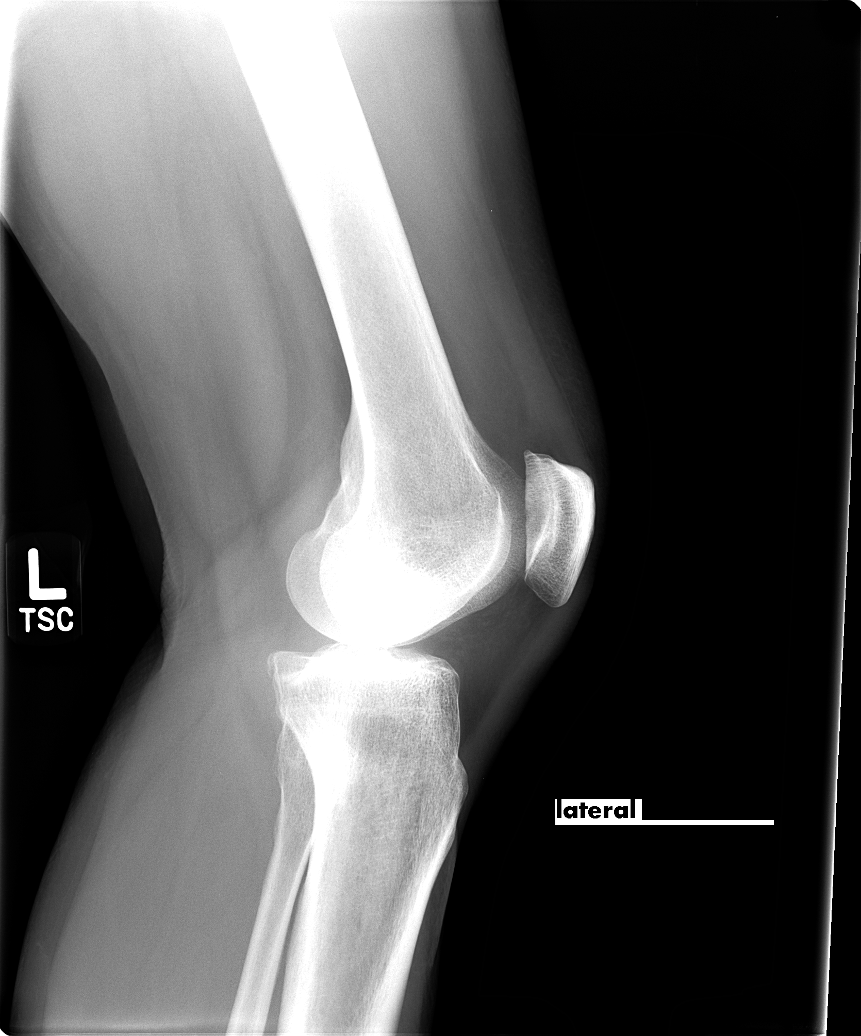

[view not recorded (5 of 5)]
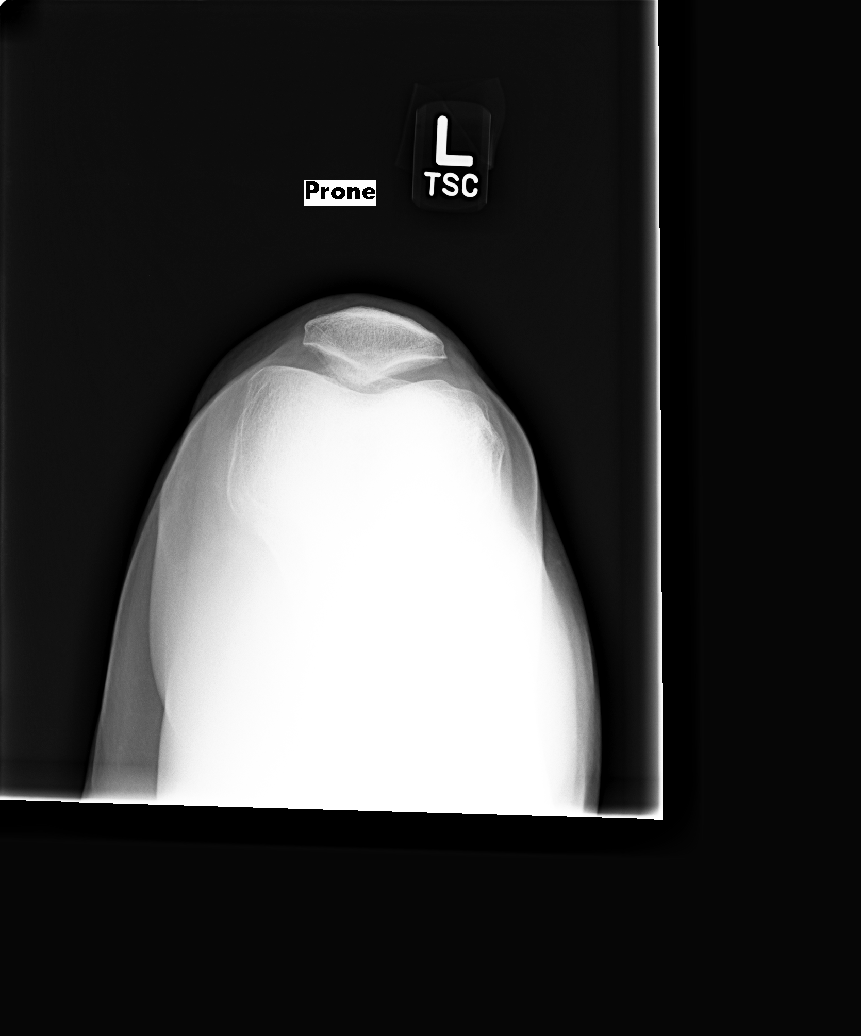

[5 of 5 positions shown; findings below may reference images not displayed]

FINDINGS: Mild spurring along the tibial spines. No acute bony abnormality.
Specifically, no fracture, subluxation, or dislocation. Soft tissues
are intact. No joint effusion.
IMPRESSION: No acute bony abnormality.
